# Patient Record
Sex: Male | Born: 1985 | Race: White | Hispanic: No | Marital: Married | Smoking: Current every day smoker
Health system: Southern US, Community
[De-identification: ages and names within clinical notes are randomized; demographics above are authoritative.]

---

## 2016-12-25 ENCOUNTER — Other Ambulatory Visit: Payer: Self-pay | Admitting: Emergency Medicine

## 2016-12-25 DIAGNOSIS — M545 Low back pain: Secondary | ICD-10-CM

## 2017-01-05 ENCOUNTER — Ambulatory Visit
Admission: RE | Admit: 2017-01-05 | Discharge: 2017-01-05 | Disposition: A | Discharge status: Home / Self Care | Payer: No Typology Code available for payment source | Source: Ambulatory Visit | Attending: Emergency Medicine | Admitting: Emergency Medicine

## 2017-01-05 DIAGNOSIS — M545 Low back pain: Secondary | ICD-10-CM

## 2019-02-11 IMAGING — MR MR LUMBAR SPINE W/O CM
4 of 5 series · 18 of 48 positions shown · non-contrast
Comparison: None.

CLINICAL DATA: Low back pain, shooting pain down both legs for 4
months, initially LEFT-sided, now bilaterally.

EXAM:
MRI LUMBAR SPINE WITHOUT CONTRAST
TECHNIQUE: Multiplanar, multisequence MR imaging of the lumbar spine was
performed. No intravenous contrast was administered.

[Series 6: T2 · sagittal · 4.0mm · 0.73mm/px · 5 of 15 slices shown (1 of 2)]
[im 1/15]
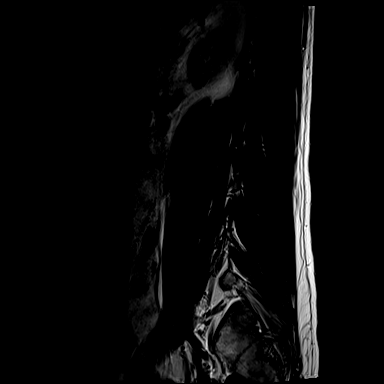
[im 4/15]
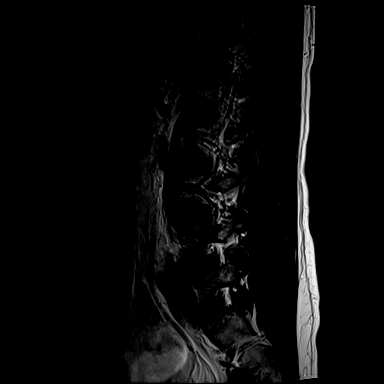
[im 8/15]
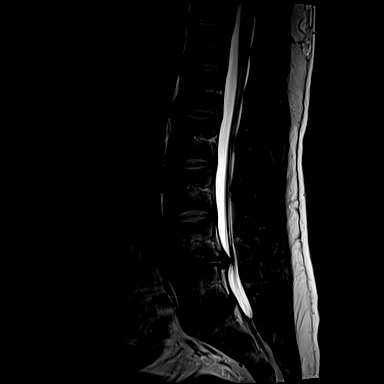
[im 11/15]
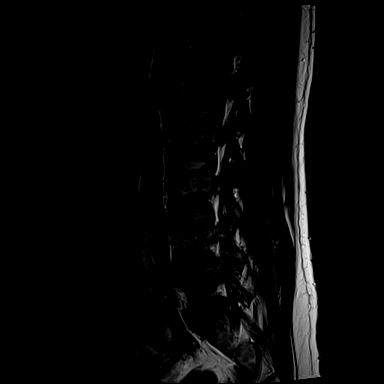
[im 15/15]
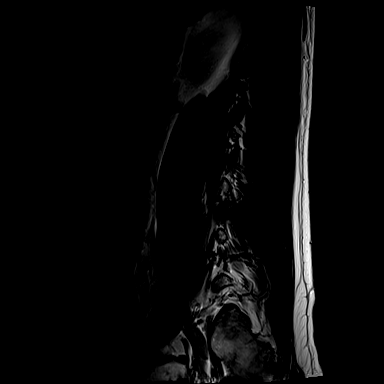

[Series 7: T1 · sagittal · 4.0mm · 0.73mm/px · 3 of 15 slices shown (1 of 2)]
[im 1/15]
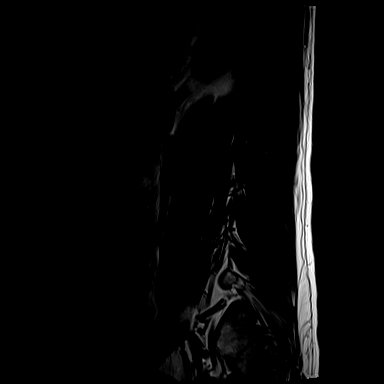
[im 8/15]
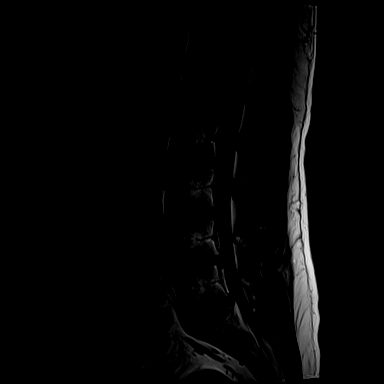
[im 15/15]
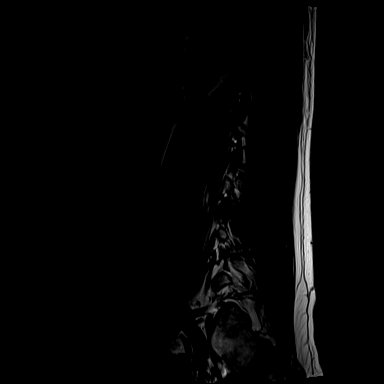

[Series 13: T2 · axial · 4.0mm · 0.28mm/px · z∈[-134,+49]mm · 7 of 42 slices shown (2 of 2)]
[im 3/42]
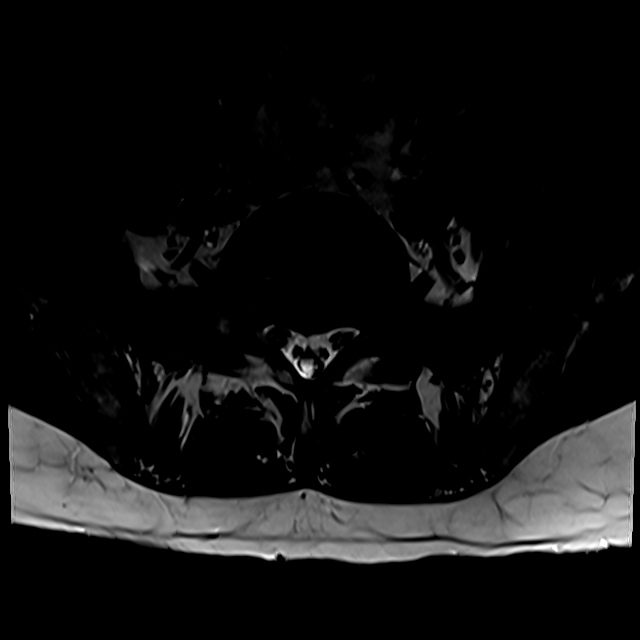
[im 6/42]
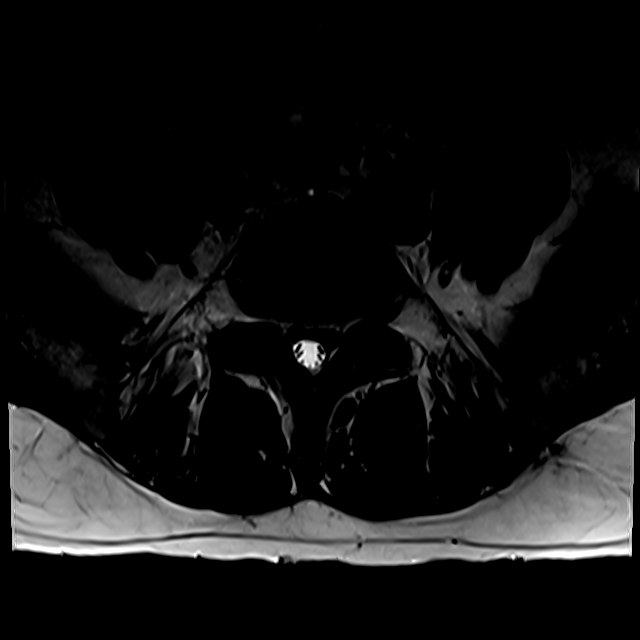
[im 9/42]
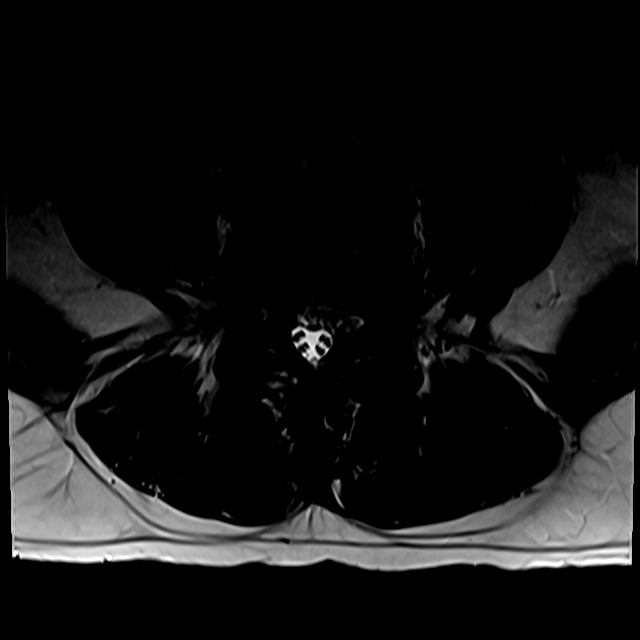
[im 14/42]
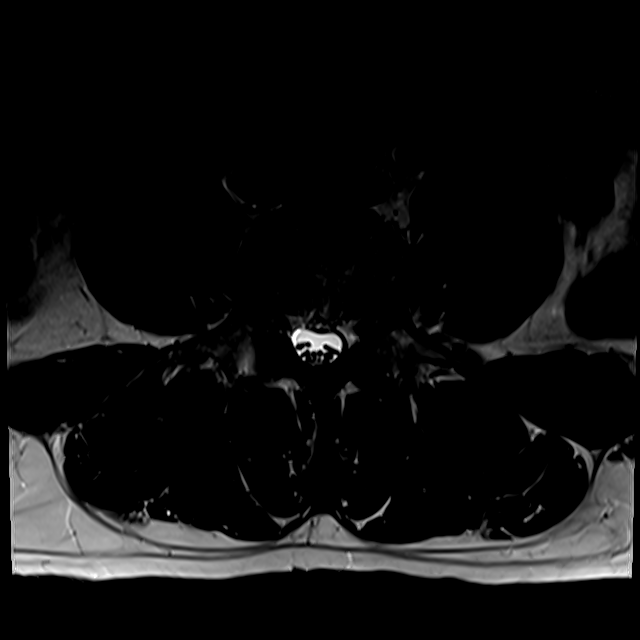
[im 20/42]
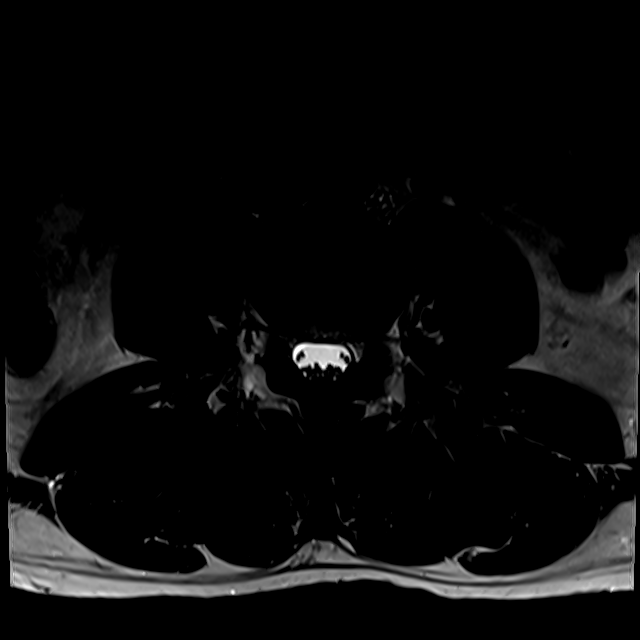
[im 22/42]
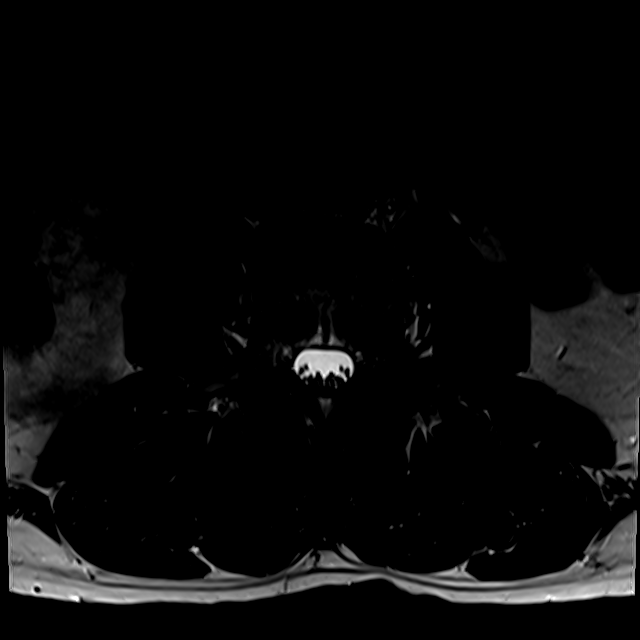
[im 36/42]
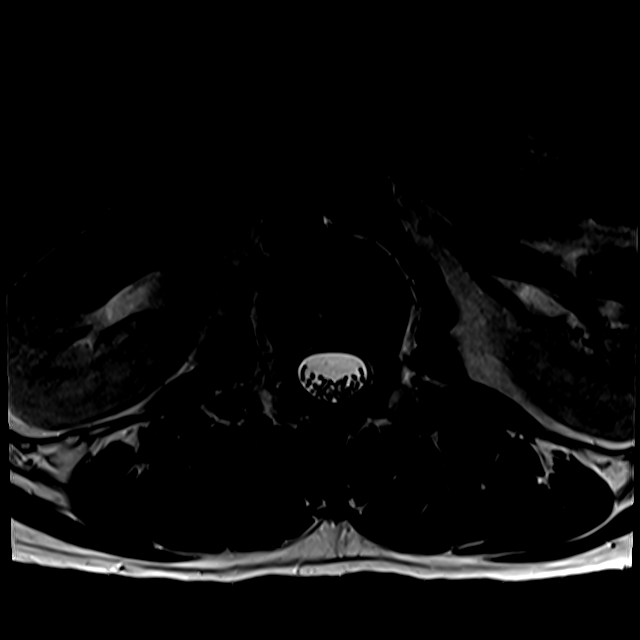

[Series 100: T1 · axial · 4.0mm · 0.28mm/px · z∈[-119,+49]mm · 3 of 42 slices shown (2 of 2)]
[im 6/42]
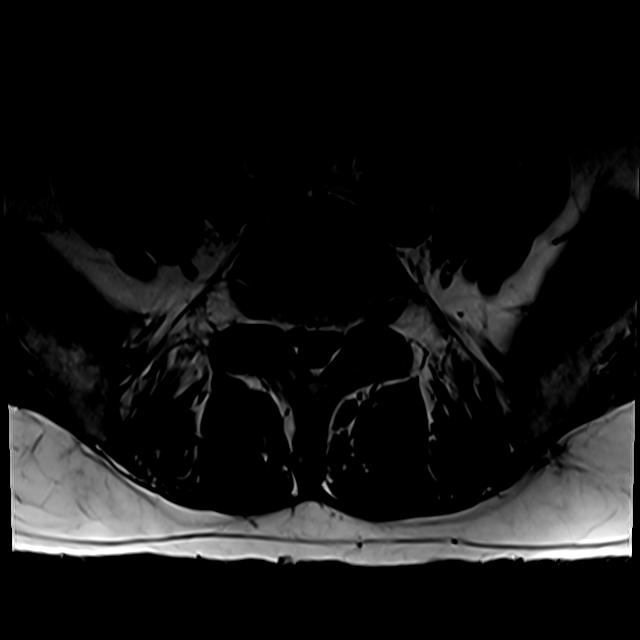
[im 22/42]
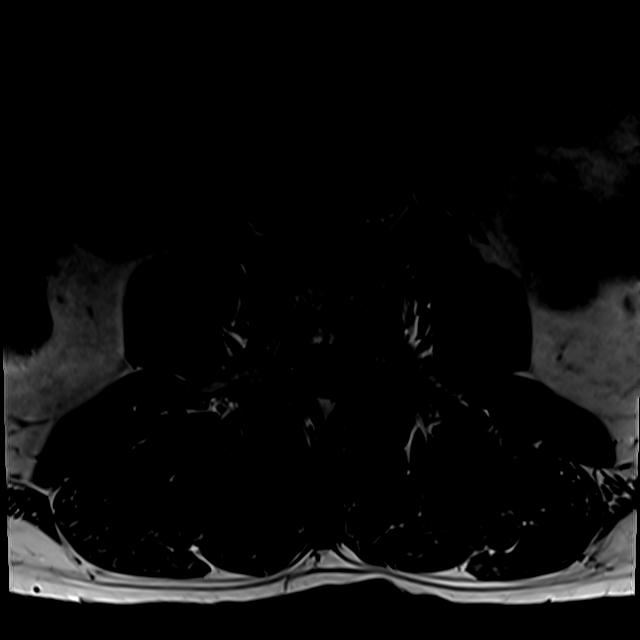
[im 36/42]
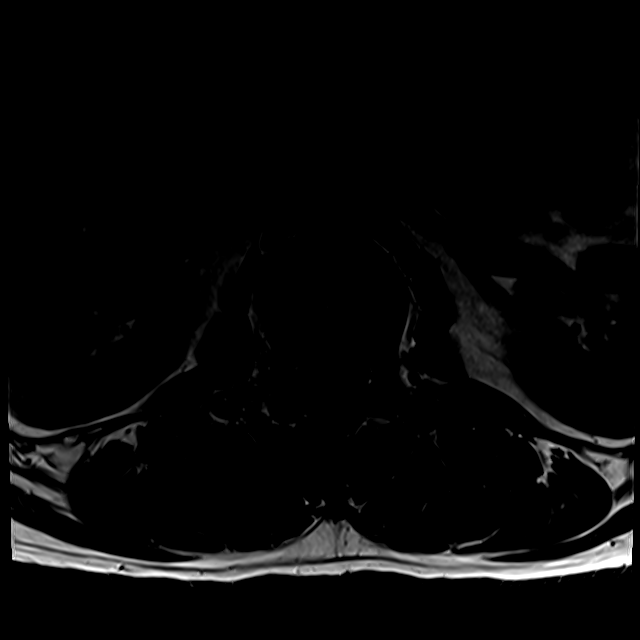

[18 of 48 positions shown; findings below may reference images not displayed]

FINDINGS: Segmentation:  Standard.

Alignment:  Physiologic.

Vertebrae:  No fracture, evidence of discitis, or bone lesion.

Conus medullaris: Extends to the L1 level and appears normal.

Paraspinal and other soft tissues: Unremarkable.

Disc levels:

L1-L2:  Normal.

L2-L3:  Normal.

L3-L4:  Normal.

L4-L5: Disc space narrowing. Large disc extrusion, probable free
fragment, contributes to severe spinal stenosis in conjunction with
facet arthropathy. There is BILATERAL subarticular zone narrowing
affecting both L5 nerve roots, slightly worse on the LEFT. No
foraminal narrowing of significance.

L5-S1: Slight disc space narrowing and desiccation. Central
protrusion. Facet arthropathy. Mild stenosis. Subarticular zone
narrowing affects both S1 nerve roots, slightly worse on the LEFT.
No foraminal narrowing is observed.
IMPRESSION: Large disc extrusion at L4-5, probable free fragment, is the
dominant abnormality. There is severe stenosis and BILATERAL L5
nerve root impingement, slightly worse on the LEFT.

Central protrusion at L5-S1 could also be symptomatic, with
subarticular zone affecting both S1 nerve roots, slightly worse on
the LEFT as well.

These results will be called to the ordering clinician or
representative by the Radiologist Assistant, and communication
documented in the PACS or zVision Dashboard.

## 2022-07-09 ENCOUNTER — Ambulatory Visit
Admission: RE | Admit: 2022-07-09 | Discharge: 2022-07-09 | Disposition: A | Discharge status: Home / Self Care | Payer: 59 | Source: Ambulatory Visit | Attending: Family Medicine | Admitting: Family Medicine

## 2022-07-09 ENCOUNTER — Ambulatory Visit (INDEPENDENT_AMBULATORY_CARE_PROVIDER_SITE_OTHER): Payer: 59

## 2022-07-09 VITALS — BP 148/90 | HR 92 | Temp 98.2°F | Resp 18 | Ht 68.0 in | Wt 180.0 lb

## 2022-07-09 DIAGNOSIS — J0140 Acute pansinusitis, unspecified: Secondary | ICD-10-CM | POA: Diagnosis not present

## 2022-07-09 DIAGNOSIS — R519 Headache, unspecified: Secondary | ICD-10-CM | POA: Diagnosis not present

## 2022-07-09 DIAGNOSIS — H5711 Ocular pain, right eye: Secondary | ICD-10-CM

## 2022-07-09 MED ORDER — PREDNISONE 20 MG PO TABS: 20.0000 mg | ORAL_TABLET | Freq: Two times a day (BID) | ORAL | 0 refills | Status: AC

## 2022-07-09 MED ORDER — AMOXICILLIN-POT CLAVULANATE 875-125 MG PO TABS: 1.0000 | ORAL_TABLET | Freq: Two times a day (BID) | ORAL | 0 refills | Status: AC

## 2022-07-09 NOTE — Discharge Instructions (Signed)
Make sure that you are drinking a lot of water (more than usual) Consider saline nasal spray or rinse Take Augmentin 2 times a day.  This is a strong antibiotic.  Consider taking a probiotic while on Augmentin Take prednisone 2 times a day for 5 days See your doctor if not improving by the end of the week

## 2022-07-09 NOTE — ED Triage Notes (Signed)
Patient c/o right sided headache behind right eye x 6 days.  Right eye is red today.  Denies any blurred vision, nausea or vomiting.  Taken Ibuprofen w/o relief.

## 2022-07-09 NOTE — ED Provider Notes (Signed)
[name]    CSN: [id number] Arrival date & time: 07/09/22  3235      History   Chief Complaint Chief Complaint  Patient presents with   Headache    6th day of headache - Entered by patient    HPI Rodney Mcdonald is a 37 y.o. male.   HPI  Healthy 37 year old male.  He is not on any prescription medication .  Does get yearly physicals.  He is a smoker. He states he does not have any headache and has not been treated for headache condition.  Very rarely will get a headache if not well.  Currently has had a headache for 6 days it is getting worse.  He states that as of last couple days he is having pain in his right eye.  No diminished vision.  No nausea or vomiting.  No problems with strength balance coordination or cognition.  He is here with his wife.  He is not having any sinus congestion or sinus infection.  He has has not had any trauma.  He states that his forehead hurts to touch, even light touch.  Ranks his headache when severe is an "8-10".  Right now it is a "5" . No headache conditions from the family  History reviewed. No pertinent past medical history.  There are no problems to display for this patient.   History reviewed. No pertinent surgical history.     Home Medications    Prior to Admission medications   Medication Sig Start Date End Date Taking? Authorizing Provider  amoxicillin-clavulanate (AUGMENTIN) 875-125 MG tablet Take 1 tablet by mouth every 12 (twelve) hours. 07/09/22  Yes Raylene Everts, MD  predniSONE (DELTASONE) 20 MG tablet Take 1 tablet (20 mg total) by mouth 2 (two) times daily with a meal. 07/09/22  Yes Raylene Everts, MD    Family History History reviewed. No pertinent family history.  Social History Social History   Tobacco Use   Smoking status: Every Day    Types: Cigarettes   Smokeless tobacco: Never  Vaping Use   Vaping Use: Never used  Substance Use Topics   Alcohol use: Yes   Drug use: Never      Allergies   Patient has no known allergies.   Review of Systems Review of Systems See HPI  Physical Exam Triage Vital Signs ED Triage Vitals  Enc Vitals Group     BP 07/09/22 0948 (!) 148/90     Pulse Rate 07/09/22 0948 92     Resp 07/09/22 0948 18     Temp 07/09/22 0948 98.2 F (36.8 C)     Temp Source 07/09/22 0948 Oral     SpO2 07/09/22 0948 97 %     Weight 07/09/22 0950 180 lb (81.6 kg)     Height 07/09/22 0950 5\' 8"  (1.727 m)     Head Circumference --      Peak Flow --      Pain Score 07/09/22 0950 5     Pain Loc --      Pain Edu? --      Excl. in [place]? --    No data found.  Updated Vital Signs BP (!) 148/90 (BP Location: Left Arm)   Pulse 92   Temp 98.2 F (36.8 C) (Oral)   Resp 18   Ht 5\' 8"  (1.727 m)   Wt 81.6 kg   SpO2 97%   BMI 27.37 kg/m      Physical Exam Constitutional:  General: He is not in acute distress.    Appearance: He is well-developed and normal weight.  HENT:     Head: Normocephalic and atraumatic. No right periorbital erythema.      Comments: No tenderness in the ethmoid sinus.  Frontal to sinus area is tender to touch.  No tenderness in maxillary areas    Mouth/Throat:     Mouth: Mucous membranes are moist.  Eyes:     General: No visual field deficit.    Extraocular Movements:     Right eye: Normal extraocular motion and no nystagmus.     Left eye: Normal extraocular motion and no nystagmus.     Conjunctiva/sclera: Conjunctivae normal.     Pupils: Pupils are equal, round, and reactive to light.     Right eye: Pupil is round and reactive.     Left eye: Pupil is round and reactive.     Comments: Discs are flat  Cardiovascular:     Rate and Rhythm: Normal rate and regular rhythm.  Pulmonary:     Effort: Pulmonary effort is normal. No respiratory distress.     Breath sounds: Normal breath sounds.  Abdominal:     General: There is no distension.     Palpations: Abdomen is soft.  Musculoskeletal:        General:  Normal range of motion.     Cervical back: Normal range of motion. No rigidity.  Skin:    General: Skin is warm and dry.  Neurological:     Mental Status: He is alert and oriented to person, place, and time. Mental status is at baseline.     Cranial Nerves: No cranial nerve deficit, dysarthria or facial asymmetry.     Motor: No weakness.     Coordination: Coordination normal.     Gait: Gait normal.  Psychiatric:        Mood and Affect: Mood normal.        Behavior: Behavior normal.     Comments: Mildly anxious about health/condition      UC Treatments / Results  Labs (all labs ordered are listed, but only abnormal results are displayed) Labs Reviewed - No data to display  EKG   Radiology CT Head Wo Contrast  Result Date: 07/09/2022 CLINICAL DATA:  37 year old male with right side headache. Pain behind the right eye for 6 days. Right eye redness. EXAM: CT HEAD WITHOUT CONTRAST TECHNIQUE: Contiguous axial images were obtained from the base of the skull through the vertex without intravenous contrast. RADIATION DOSE REDUCTION: This exam was performed according to the departmental dose-optimization program which includes automated exposure control, adjustment of the mA and/or kV according to patient size and/or use of iterative reconstruction technique. COMPARISON:  None Available. FINDINGS: Brain: Cerebral volume is within normal limits No midline shift, ventriculomegaly, mass effect, evidence of mass lesion, intracranial hemorrhage or evidence of cortically based acute infarction. Gray-white matter differentiation is within normal limits throughout the brain. Vascular: No suspicious intracranial vascular hyperdensity. Skull: No osseous abnormality identified. Sinuses/Orbits: Moderately opacified paranasal sinuses, with combined circumferential mucosal thickening and bubbly opacity in the left maxillary sinus. Right greater than left ethmoid mucosal thickening and opacification. Subtotal  opacification of the right frontal sinus. No complicating features identified. Tympanic cavities and mastoids remain clear. Other: Visualized orbit soft tissues are within normal limits. Visualized scalp soft tissues are within normal limits. IMPRESSION: 1. Normal noncontrast CT appearance of the brain. 2. Moderate widespread paranasal sinus inflammation. Consider acute sinusitis. No complicating features  identified. Electronically Signed   By: Genevie Ann M.D.   On: 07/09/2022 10:38    Procedures Procedures (including critical care time)  Medications Ordered in UC Medications - No data to display  Initial Impression / Assessment and Plan / UC Course  I have reviewed the triage vital signs and the nursing notes.  Pertinent labs & imaging results that were available during my care of the patient were reviewed by me and considered in my medical decision making (see chart for details).     I called and spoke with the neuroradiologist on duty.  He recommends a CT without contrast.  Additional testing to follow Diagnosis of sinusitis we will treat with antibiotics.  Prednisone.  Fluids.  Return as needed Final Clinical Impressions(s) / UC Diagnoses   Final diagnoses:  Acute non-recurrent pansinusitis     Discharge Instructions      Make sure that you are drinking a lot of water (more than usual) Consider saline nasal spray or rinse Take Augmentin 2 times a day.  This is a strong antibiotic.  Consider taking a probiotic while on Augmentin Take prednisone 2 times a day for 5 days See your doctor if not improving by the end of the week     ED Prescriptions     Medication Sig Dispense Auth. Provider   amoxicillin-clavulanate (AUGMENTIN) 875-125 MG tablet Take 1 tablet by mouth every 12 (twelve) hours. 20 tablet Raylene Everts, MD   predniSONE (DELTASONE) 20 MG tablet Take 1 tablet (20 mg total) by mouth 2 (two) times daily with a meal. 10 tablet Raylene Everts, MD      PDMP not  reviewed this encounter.   Raylene Everts, MD 07/09/22 1110

## 2022-07-10 ENCOUNTER — Telehealth: Payer: Self-pay

## 2022-07-10 NOTE — Telephone Encounter (Signed)
TC to f/u with pt after yesterday's visit to [facility]. Pt states he feels about the same but is taking prescribed medication. Advised to f/u w/ PCP or [facility] if s/s are unresolved after medication is completed; pt verbalizes understanding. No further concerns/questions.

## 2022-07-30 ENCOUNTER — Telehealth: Payer: Self-pay

## 2022-07-30 NOTE — Telephone Encounter (Signed)
Pt calls [facility] to request refill of antibiotic that Dr. Meda Coffee prescribed on 07/09/22 for a sinus infection. He states that she told him to call back if he was not better after completing antibiotic. He reports feeling better until approx one week ago when s/s returned. Informed that generally no antibiotic refills are called in without pt being reevaluated; but informed that he could call [facility] on 08/01/22 to discuss this w/  Dr. Meda Coffee. Urged to be reevaluated as soon as possible for worsening s/s. Pt verbalizes understanding.

## 2022-08-01 ENCOUNTER — Telehealth: Payer: Self-pay | Admitting: Family Medicine

## 2022-08-01 NOTE — Telephone Encounter (Signed)
Patient called on 07/30/2022 and then again today to request that I refill his antibiotics. I do not feel it is good medical practice to refill antibiotics without seeing the patient, further, his symptoms are severe enough that I believe he should be seen by an ENT.  He is advised to call his primary care doctor for referral.

## 2022-08-01 NOTE — Telephone Encounter (Signed)
Call back to [facility] to update - pt given contact info  for [facility] in [facility] where pt lives. Can also continue w/ [facility] sinus rinses. Pt to check w/ insurance to see if he needs a referral or if he can cal them directly to schedule
